# Patient Record
Sex: Female | Born: 1995 | Race: Black or African American | Hispanic: No | Marital: Single | Smoking: Never smoker
Health system: Southern US, Community
[De-identification: ages and names within clinical notes are randomized; demographics above are authoritative.]

---

## 2014-05-10 ENCOUNTER — Emergency Department (HOSPITAL_COMMUNITY): Payer: Medicaid Other

## 2014-05-10 ENCOUNTER — Encounter (HOSPITAL_COMMUNITY): Payer: Self-pay | Admitting: Emergency Medicine

## 2014-05-10 ENCOUNTER — Emergency Department (HOSPITAL_COMMUNITY)
Admission: EM | Admit: 2014-05-10 | Discharge: 2014-05-10 | Disposition: A | Discharge status: Home / Self Care | Payer: Medicaid Other | Attending: Emergency Medicine | Admitting: Emergency Medicine

## 2014-05-10 DIAGNOSIS — Y9363 Activity, rugby: Secondary | ICD-10-CM | POA: Diagnosis not present

## 2014-05-10 DIAGNOSIS — S99919A Unspecified injury of unspecified ankle, initial encounter: Secondary | ICD-10-CM

## 2014-05-10 DIAGNOSIS — Z3202 Encounter for pregnancy test, result negative: Secondary | ICD-10-CM | POA: Insufficient documentation

## 2014-05-10 DIAGNOSIS — S99929A Unspecified injury of unspecified foot, initial encounter: Secondary | ICD-10-CM

## 2014-05-10 DIAGNOSIS — Y9229 Other specified public building as the place of occurrence of the external cause: Secondary | ICD-10-CM | POA: Insufficient documentation

## 2014-05-10 DIAGNOSIS — W219XXA Striking against or struck by unspecified sports equipment, initial encounter: Secondary | ICD-10-CM | POA: Diagnosis not present

## 2014-05-10 DIAGNOSIS — S8990XA Unspecified injury of unspecified lower leg, initial encounter: Secondary | ICD-10-CM | POA: Diagnosis present

## 2014-05-10 DIAGNOSIS — Z79899 Other long term (current) drug therapy: Secondary | ICD-10-CM | POA: Diagnosis not present

## 2014-05-10 DIAGNOSIS — S93409A Sprain of unspecified ligament of unspecified ankle, initial encounter: Secondary | ICD-10-CM | POA: Diagnosis not present

## 2014-05-10 DIAGNOSIS — S93402A Sprain of unspecified ligament of left ankle, initial encounter: Secondary | ICD-10-CM

## 2014-05-10 LAB — POC URINE PREG, ED: Preg Test, Ur: NEGATIVE

## 2014-05-10 MED ORDER — TRAMADOL HCL 50 MG PO TABS: 50.0000 mg | ORAL_TABLET | Freq: Four times a day (QID) | ORAL | Status: DC | PRN

## 2014-05-10 MED ORDER — METHOCARBAMOL 500 MG PO TABS: 500.0000 mg | ORAL_TABLET | Freq: Two times a day (BID) | ORAL | Status: DC

## 2014-05-10 MED ORDER — ONDANSETRON HCL 4 MG/2ML IJ SOLN
4.0000 mg | Freq: Once | INTRAMUSCULAR | Status: AC
  Administered 2014-05-10: 4 mg via INTRAVENOUS
  Filled 2014-05-10: qty 2

## 2014-05-10 MED ORDER — MORPHINE SULFATE 4 MG/ML IJ SOLN
4.0000 mg | Freq: Once | INTRAMUSCULAR | Status: AC
  Administered 2014-05-10: 4 mg via INTRAVENOUS
  Filled 2014-05-10: qty 1

## 2014-05-10 NOTE — ED Notes (Signed)
Bed: VW09 Expected date: 05/10/14 Expected time: 1:56 PM Means of arrival: Ambulance Comments: Ankle injury

## 2014-05-10 NOTE — Discharge Instructions (Signed)

## 2014-05-10 NOTE — ED Provider Notes (Signed)
CSN: [id number]     Arrival date & time 05/10/14  1407 History   First MD Initiated Contact with Patient 05/10/14 1458     Chief Complaint  Patient presents with  . Ankle Injury     (Consider location/radiation/quality/duration/timing/severity/associated sxs/prior Treatment) HPI  18 year old female presents via EMS for evaluation of L ankle injury.  Patient plays rugby for school. Approximately 2 hours ago while playing patient was knocked down to the ground with her left foot planted on the ground. She report acute onset of sharp intense pain to her left lower leg above her ankle. She was unable to ambulate afterward.  She described the injury as "a pencil snapped in halves".  Pain worsening with ankle or leg movement.  No associated numbness or weakness.  No knee or hip pain.  Pt received fentanyl PTA that brought her pain down to a 6/10.  No prior injury to same area.  No other complaints.    History reviewed. No pertinent past medical history. History reviewed. No pertinent past surgical history. History reviewed. No pertinent family history. History  Substance Use Topics  . Smoking status: Never Smoker   . Smokeless tobacco: Not on file  . Alcohol Use: No   OB History   Grav Para Term Preterm Abortions TAB SAB Ect Mult Living                 Review of Systems  Constitutional: Negative for fever.  Musculoskeletal: Positive for arthralgias.  Neurological: Negative for numbness.      Allergies  Aspirin and Pork-derived products  Home Medications   Prior to Admission medications   Medication Sig Start Date End Date Taking? Authorizing Provider  albuterol (PROVENTIL HFA;VENTOLIN HFA) 108 (90 BASE) MCG/ACT inhaler Inhale 1 puff into the lungs every 6 (six) hours as needed for wheezing or shortness of breath.   Yes Historical Provider, MD   BP 120/76  Pulse 75  Temp(Src) 98.6 F (37 C) (Oral)  Resp 16  SpO2 99%  LMP 04/29/2014 Physical Exam  Nursing note and  vitals reviewed. Constitutional: She is oriented to person, place, and time. She appears well-developed and well-nourished. No distress.  HENT:  Head: Atraumatic.  Eyes: Conjunctivae are normal.  Neck: Neck supple.  Musculoskeletal: She exhibits tenderness (L leg: point tenderness to distal tibfib without crepitus or obvious deformity.  edema and mild bruising noted.  L ankle with decreased ROM 2/2 to pain, no deformity.  pedal pulse palpable, brisk cap refills, sensation intact distally.).  L knee and L hip without tenderness.    Neurological: She is alert and oriented to person, place, and time.  Skin: No rash noted.  Psychiatric: She has a normal mood and affect.    ED Course  Procedures (including critical care time)  3:14 PM L tibfib/ankle injury.  Pt is NVI.  Xray ordered. Pain medication given.      4:31 PM X-ray of left tib-fib and left ankle without acute fracture or dislocation. This is likely a sprain. We'll provide ASO, crutches, rice therapy, an orthopedic referral as needed. Return precautions discussed.  Labs Review Labs Reviewed  POC URINE PREG, ED    Imaging Review Dg Tibia/fibula Left  05/10/2014   CLINICAL DATA:  Ankle injury.  III accident.  Distal leg pain.  EXAM: LEFT TIBIA AND FIBULA - 2 VIEW  COMPARISON:  Left ankle radiographs performed 05/10/2014.  FINDINGS: Lateral view of the proximal 2/3 of the tibia and fibula is  included on the left ankle radiograph study performed at this exam today. Please note that the distal tibia-fibula are seen on the concurrent ankle radiographs, dictated separately.  No fracture is identified in the tibia or fibula. No suspicious bony lesion is seen.  IMPRESSION: No acute bony abnormality. Please also see concurrent left ankle radiographs.   Electronically Signed   By: Britta Mccreedy M.D.   On: 05/10/2014 15:58   Dg Ankle Complete Left  05/10/2014   CLINICAL DATA:  Leg injury. Leg pain. Acute injury. Initial encounter.  EXAM: LEFT  ANKLE COMPLETE - 3+ VIEW  COMPARISON:  None.  FINDINGS: Anatomic alignment. The ankle mortise is congruent. The talar dome is intact. Soft tissues appear within normal limits.  IMPRESSION: Negative.   Electronically Signed   By: Andreas Newport M.D.   On: 05/10/2014 15:55     EKG Interpretation None      MDM   Final diagnoses:  Left ankle sprain, initial encounter    BP 120/76  Pulse 75  Temp(Src) 98.6 F (37 C) (Oral)  Resp 16  SpO2 99%  LMP 04/29/2014  I have reviewed nursing notes and vital signs. I personally reviewed the imaging tests through PACS system  I reviewed available ER/hospitalization records thought the EMR     Fayrene Helper, [place] 05/10/14 1631

## 2014-05-10 NOTE — ED Notes (Signed)
Per EMS, pt was playing rugby.  Pt fell and twisted left ankle upon planting foot.  Pt complaining of lower leg pain.  Some swelling to ankle with discoloration.  No medical history.  Vitals:  114/72, 78, resp 22.  Fentanyl 100 mcg.  IV left hand, 22 g

## 2014-05-10 NOTE — ED Notes (Signed)
Ortho at bedside.

## 2014-05-11 ENCOUNTER — Encounter (HOSPITAL_COMMUNITY): Payer: Self-pay | Admitting: Emergency Medicine

## 2014-05-11 ENCOUNTER — Emergency Department (HOSPITAL_COMMUNITY)
Admission: EM | Admit: 2014-05-11 | Discharge: 2014-05-11 | Disposition: A | Discharge status: Home / Self Care | Payer: Medicaid Other | Attending: Emergency Medicine | Admitting: Emergency Medicine

## 2014-05-11 DIAGNOSIS — G8911 Acute pain due to trauma: Secondary | ICD-10-CM | POA: Diagnosis not present

## 2014-05-11 DIAGNOSIS — M25579 Pain in unspecified ankle and joints of unspecified foot: Secondary | ICD-10-CM | POA: Insufficient documentation

## 2014-05-11 DIAGNOSIS — Z79899 Other long term (current) drug therapy: Secondary | ICD-10-CM | POA: Insufficient documentation

## 2014-05-11 DIAGNOSIS — M25572 Pain in left ankle and joints of left foot: Secondary | ICD-10-CM

## 2014-05-11 MED ORDER — HYDROCODONE-ACETAMINOPHEN 5-325 MG PO TABS
2.0000 | ORAL_TABLET | Freq: Once | ORAL | Status: AC
  Administered 2014-05-11: 2 via ORAL
  Filled 2014-05-11: qty 2

## 2014-05-11 MED ORDER — HYDROCODONE-ACETAMINOPHEN 5-325 MG PO TABS: 1.0000 | ORAL_TABLET | ORAL | Status: DC | PRN

## 2014-05-11 NOTE — ED Provider Notes (Signed)
Medical screening examination/treatment/procedure(s) were performed by non-physician practitioner and as supervising physician I was immediately available for consultation/collaboration.   Makaylen Thieme L Bandy Honaker, MD 05/11/14 1021 

## 2014-05-11 NOTE — ED Provider Notes (Signed)
CSN: [id number]     Arrival date & time 05/11/14  1740 History   First MD Initiated Contact with Patient 05/11/14 1955     Chief Complaint  Patient presents with  . Leg Pain     (Consider location/radiation/quality/duration/timing/severity/associated sxs/prior Treatment) The history is provided by the patient and medical records.    Patient who seen in the emergency department yesterday for a left ankle injury returns today with continued pain. Patient was seen in the emergency department after her being tackled during a rugby match. States she had planted her left foot and was tackled, heard a pop when this occurred. Has pain over the anterior medial aspect of left ankle.  X-rays of the ankle and tib-fib yesterday were negative. Patient's denies any new injury or change in the pain. Denies weakness or numbness of the foot or leg. States she is taking the Robaxin and Ultram with no relief. He is wearing the ASO brace and using the crutches as directed.  History reviewed. No pertinent past medical history. History reviewed. No pertinent past surgical history. History reviewed. No pertinent family history. History  Substance Use Topics  . Smoking status: Never Smoker   . Smokeless tobacco: Not on file  . Alcohol Use: No   OB History   Grav Para Term Preterm Abortions TAB SAB Ect Mult Living                 Review of Systems  Cardiovascular: Negative for leg swelling.  Musculoskeletal: Positive for arthralgias.  Skin: Negative for color change.  Allergic/Immunologic: Negative for immunocompromised state.  Neurological: Negative for weakness and numbness.  Hematological: Does not bruise/bleed easily.      Allergies  Aspirin and Pork-derived products  Home Medications   Prior to Admission medications   Medication Sig Start Date End Date Taking? Authorizing Provider  albuterol (PROVENTIL HFA;VENTOLIN HFA) 108 (90 BASE) MCG/ACT inhaler Inhale 1 puff into the lungs every 6  (six) hours as needed for wheezing or shortness of breath.    Historical Provider, MD  methocarbamol (ROBAXIN) 500 MG tablet Take 1 tablet (500 mg total) by mouth 2 (two) times daily. 05/10/14   Fayrene Helper, PA-C  traMADol (ULTRAM) 50 MG tablet Take 1 tablet (50 mg total) by mouth every 6 (six) hours as needed. 05/10/14   Fayrene Helper, PA-C   BP 137/62  Pulse 86  Temp(Src) 98.6 F (37 C) (Oral)  Resp 18  SpO2 99%  LMP 04/29/2014 Physical Exam  Nursing note and vitals reviewed. Constitutional: She appears well-developed and well-nourished. No distress.  HENT:  Head: Normocephalic and atraumatic.  Neck: Neck supple.  Pulmonary/Chest: Effort normal.  Musculoskeletal:       Legs: Left lower extremity with tenderness over the anterior ankle and lateral ankle. Distal sensation intact distal pulses intact patient able to move all toes. Calf is soft, nontender. Ankle joint is stable, no laxity with stress in any direction.   Neurological: She is alert.  Skin: She is not diaphoretic.    ED Course  Procedures (including critical care time) Labs Review Labs Reviewed - No data to display  Imaging Review Dg Tibia/fibula Left  05/10/2014   CLINICAL DATA:  Ankle injury.  III accident.  Distal leg pain.  EXAM: LEFT TIBIA AND FIBULA - 2 VIEW  COMPARISON:  Left ankle radiographs performed 05/10/2014.  FINDINGS: Lateral view of the proximal 2/3 of the tibia and fibula is included on the left ankle radiograph study performed at this exam  today. Please note that the distal tibia-fibula are seen on the concurrent ankle radiographs, dictated separately.  No fracture is identified in the tibia or fibula. No suspicious bony lesion is seen.  IMPRESSION: No acute bony abnormality. Please also see concurrent left ankle radiographs.   Electronically Signed   By: Britta Mccreedy M.D.   On: 05/10/2014 15:58   Dg Ankle Complete Left  05/10/2014   CLINICAL DATA:  Leg injury. Leg pain. Acute injury. Initial encounter.   EXAM: LEFT ANKLE COMPLETE - 3+ VIEW  COMPARISON:  None.  FINDINGS: Anatomic alignment. The ankle mortise is congruent. The talar dome is intact. Soft tissues appear within normal limits.  IMPRESSION: Negative.   Electronically Signed   By: Andreas Newport M.D.   On: 05/10/2014 15:55     EKG Interpretation None      MDM   Final diagnoses:  Left ankle pain    Afebrile, nontoxic patient with left ankle injury while playing rugby last night.  Seen in ED yesterday with negative xrays.  Neurovascularly intact.  Joint is stable.  No new injury.  Pt returns because ultram and robaxin not helping pain.  Pt given norco and d/c home with same.  PCP follow up, ortho follow up given if persistent symptoms. D/C home.  Discussed result, findings, treatment, and follow up  with patient.  Pt given return precautions.  Pt verbalizes understanding and agrees with plan.        Trixie Dredge, PA-C 05/11/14 2102

## 2014-05-11 NOTE — Discharge Instructions (Signed)
Read the information below.  Use the prescribed medication as directed.  Please discuss all new medications with your pharmacist.  Do not take additional tylenol while taking the prescribed pain medication to avoid overdose.  You may return to the Emergency Department at any time for worsening condition or any new symptoms that concern you.  If there is any possibility that you might be pregnant, please let your health care provider know and discuss this with the pharmacist to ensure medication safety.  If you develop uncontrolled pain, weakness or numbness of the extremity, severe discoloration of the skin, or you are unable to move your toes, return to the ER for a recheck.      Ankle Pain Ankle pain is a common symptom. The bones, cartilage, tendons, and muscles of the ankle joint perform a lot of work each day. The ankle joint holds your body weight and allows you to move around. Ankle pain can occur on either side or back of 1 or both ankles. Ankle pain may be sharp and burning or dull and aching. There may be tenderness, stiffness, redness, or warmth around the ankle. The pain occurs more often when a person walks or puts pressure on the ankle. CAUSES  There are many reasons ankle pain can develop. It is important to work with your caregiver to identify the cause since many conditions can impact the bones, cartilage, muscles, and tendons. Causes for ankle pain include:  Injury, including a break (fracture), sprain, or strain often due to a fall, sports, or a high-impact activity.  Swelling (inflammation) of a tendon (tendonitis).  Achilles tendon rupture.  Ankle instability after repeated sprains and strains.  Poor foot alignment.  Pressure on a nerve (tarsal tunnel syndrome).  Arthritis in the ankle or the lining of the ankle.  Crystal formation in the ankle (gout or pseudogout). DIAGNOSIS  A diagnosis is based on your medical history, your symptoms, results of your physical exam,  and results of diagnostic tests. Diagnostic tests may include X-ray exams or a computerized magnetic scan (magnetic resonance imaging, MRI). TREATMENT  Treatment will depend on the cause of your ankle pain and may include:  Keeping pressure off the ankle and limiting activities.  Using crutches or other walking support (a cane or brace).  Using rest, ice, compression, and elevation.  Participating in physical therapy or home exercises.  Wearing shoe inserts or special shoes.  Losing weight.  Taking medications to reduce pain or swelling or receiving an injection.  Undergoing surgery. HOME CARE INSTRUCTIONS   Only take over-the-counter or prescription medicines for pain, discomfort, or fever as directed by your caregiver.  Put ice on the injured area.  Put ice in a plastic bag.  Place a towel between your skin and the bag.  Leave the ice on for 15-20 minutes at a time, 03-04 times a day.  Keep your leg raised (elevated) when possible to lessen swelling.  Avoid activities that cause ankle pain.  Follow specific exercises as directed by your caregiver.  Record how often you have ankle pain, the location of the pain, and what it feels like. This information may be helpful to you and your caregiver.  Ask your caregiver about returning to work or sports and whether you should drive.  Follow up with your caregiver for further examination, therapy, or testing as directed. SEEK MEDICAL CARE IF:   Pain or swelling continues or worsens beyond 1 week.  You have an oral temperature above 102 F (38.9  C).  You are feeling unwell or have chills.  You are having an increasingly difficult time with walking.  You have loss of sensation or other new symptoms.  You have questions or concerns. MAKE SURE YOU:   Understand these instructions.  Will watch your condition.  Will get help right away if you are not doing well or get worse. Document Released: 02/02/2010 Document  Revised: 11/07/2011 Document Reviewed: 02/02/2010 [facility] Patient Information 2015 [place], [place]. This information is not intended to replace advice given to you by your health care provider. Make sure you discuss any questions you have with your health care provider.

## 2014-05-11 NOTE — ED Provider Notes (Signed)
Medical screening examination/treatment/procedure(s) were performed by non-physician practitioner and as supervising physician I was immediately available for consultation/collaboration.   EKG Interpretation None        Elwin Mocha, MD 05/11/14 [phone]

## 2014-05-11 NOTE — ED Notes (Signed)
Pt reports playing yesterday and injuring left leg, was seen at [facility] yesterday for it and was told it was a sprain. Reports no relief with pain meds. Pt has splint on left ankle and using crutches. Reports pain is left lower leg and ankle.

## 2015-01-16 ENCOUNTER — Encounter (HOSPITAL_COMMUNITY): Payer: Self-pay

## 2015-01-16 ENCOUNTER — Emergency Department (HOSPITAL_COMMUNITY)
Admission: EM | Admit: 2015-01-16 | Discharge: 2015-01-16 | Disposition: A | Discharge status: Home / Self Care | Payer: Medicaid Other | Attending: Emergency Medicine | Admitting: Emergency Medicine

## 2015-01-16 DIAGNOSIS — L729 Follicular cyst of the skin and subcutaneous tissue, unspecified: Secondary | ICD-10-CM | POA: Diagnosis present

## 2015-01-16 DIAGNOSIS — N611 Abscess of the breast and nipple: Secondary | ICD-10-CM

## 2015-01-16 DIAGNOSIS — Z79899 Other long term (current) drug therapy: Secondary | ICD-10-CM | POA: Insufficient documentation

## 2015-01-16 DIAGNOSIS — Z792 Long term (current) use of antibiotics: Secondary | ICD-10-CM | POA: Diagnosis not present

## 2015-01-16 DIAGNOSIS — N61 Inflammatory disorders of breast: Secondary | ICD-10-CM | POA: Insufficient documentation

## 2015-01-16 MED ORDER — CEPHALEXIN 500 MG PO CAPS: 500.0000 mg | ORAL_CAPSULE | Freq: Four times a day (QID) | ORAL | Status: DC

## 2015-01-16 NOTE — ED Notes (Signed)
Pt presents with 1 week h/o cyst to R breast, pt denies any redness or drainage from area, reports area is tender to palpation.

## 2015-01-16 NOTE — Discharge Instructions (Signed)

## 2015-01-16 NOTE — ED Provider Notes (Signed)
CSN: [id number]     Arrival date & time 01/16/15  1302 History  This chart was scribed for non-physician practitioner Marlon Peliffany Aleeza [place], PA, working with Elwin MochaBlair Walden, MD, by Tanda RockersMargaux Venter, ED Scribe. This patient was seen in room TR08C/TR08C and the patient's care was started at 1:38 PM.     Chief Complaint  Patient presents with  . Cyst   The history is provided by the patient. No language interpreter was used.     HPI Comments: Betty Odonnell is a 19 y.o. female who presents to the Emergency Department complaining of lump to right breast x 1.5 weeks. Pt notes increased pain to the area. No redness or drainage to the area. Denies FHx cysts on breasts or endometriosis. Pt does have routine visits to a gynecologist. Denies fever, chills, or any other associated symptoms.    History reviewed. No pertinent past medical history. History reviewed. No pertinent past surgical history. History reviewed. No pertinent family history. History  Substance Use Topics  . Smoking status: Never Smoker   . Smokeless tobacco: Not on file  . Alcohol Use: No   OB History    No data available     Review of Systems  Constitutional: Negative for fever and chills.  Skin:       Lump to right breast.   All other systems reviewed and are negative.   Allergies  Aspirin and Pork-derived products  Home Medications   Prior to Admission medications   Medication Sig Start Date End Date Taking? Authorizing Provider  albuterol (PROVENTIL HFA;VENTOLIN HFA) 108 (90 BASE) MCG/ACT inhaler Inhale 1 puff into the lungs every 6 (six) hours as needed for wheezing or shortness of breath.    Historical Provider, MD  cephALEXin (KEFLEX) 500 MG capsule Take 1 capsule (500 mg total) by mouth 4 (four) times daily. 01/16/15   Marlon Peliffany Kataya [place], PA-C  HYDROcodone-acetaminophen (NORCO/VICODIN) 5-325 MG per tablet Take 1-2 tablets by mouth every 4 (four) hours as needed. 05/11/14   Trixie DredgeEmily West, PA-C  methocarbamol (ROBAXIN)  500 MG tablet Take 1 tablet (500 mg total) by mouth 2 (two) times daily. 05/10/14   Fayrene HelperBowie Tran, PA-C  traMADol (ULTRAM) 50 MG tablet Take 1 tablet (50 mg total) by mouth every 6 (six) hours as needed. 05/10/14   Fayrene HelperBowie Tran, PA-C   Triage Vitals: BP 121/67 mmHg  Pulse 60  Temp(Src) 98.4 F (36.9 C) (Oral)  Resp 16  Ht 5\' 3"  (1.6 m)  SpO2 99%  LMP 01/05/2015 (Exact Date)   Physical Exam  Constitutional: She is oriented to person, place, and time. She appears well-developed and well-nourished. No distress.  HENT:  Head: Normocephalic and atraumatic.  Eyes: Conjunctivae and EOM are normal.  Neck: Neck supple. No tracheal deviation present.  Cardiovascular: Normal rate.   Pulmonary/Chest: Effort normal. No respiratory distress.    Musculoskeletal: Normal range of motion.  Neurological: She is alert and oriented to person, place, and time.  Skin: Skin is warm and dry.  Psychiatric: She has a normal mood and affect. Her behavior is normal.  Nursing note and vitals reviewed.   ED Course  Procedures (including critical care time)  DIAGNOSTIC STUDIES: Oxygen Saturation is 99% on RA, normal by my interpretation.    COORDINATION OF CARE: 1:41 PM-Discussed treatment plan which includes rx antibiotic with pt at bedside and pt agreed to plan. [facility] if abscess increases in size and the abx are not affective. She should see  improvement in pain and size within 24-48 hours. Strict return to ED precautions. Also advised [facility] is [facility] as well.   The patient has firmness that is small approx 1 cm and tender. Without obvious fluid collection and it being on the breast we will try a trial of   Labs Review Labs Reviewed - No data to display  Imaging Review No results found.   EKG Interpretation None      MDM   Final diagnoses:  Breast abscess of female   19 y.o.Betty Odonnell's evaluation in the Emergency Department is  complete. It has been determined that no acute conditions requiring further emergency intervention are present at this time. The patient/guardian have been advised of the diagnosis and plan. We have discussed signs and symptoms that warrant return to the ED, such as changes or worsening in symptoms.  Vital signs are stable at discharge. Filed Vitals:   01/16/15 1313  BP: 121/67  Pulse: 60  Temp: 98.4 F (36.9 C)  Resp: 16    Patient/guardian has voiced understanding and agreed to follow-up with the PCP or specialist.  I personally performed the services described in this documentation, which was scribed in my presence. The recorded information has been reviewed and is accurate.     Marlon Peliffany Breken Nazari, PA-C 01/16/15 1547  Elwin MochaBlair Walden, MD 01/16/15 [phone]

## 2015-06-18 IMAGING — CR DG ANKLE COMPLETE 3+V*L*
4 series · 4 of 4 positions shown · non-contrast
Comparison: None.

CLINICAL DATA: Leg injury. Leg pain. Acute injury. Initial
encounter.

EXAM:
LEFT ANKLE COMPLETE - 3+ VIEW

[x ankle ap left]
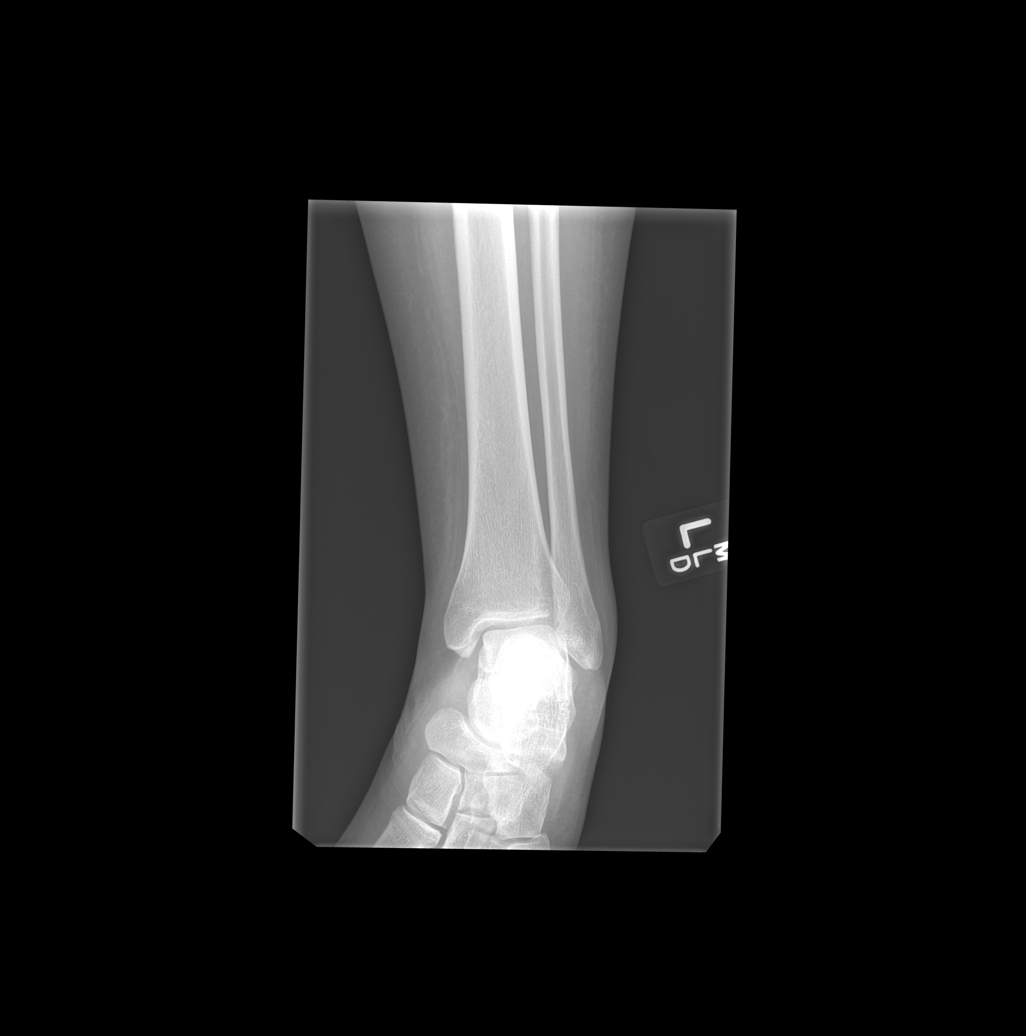

[x ankle obl left]
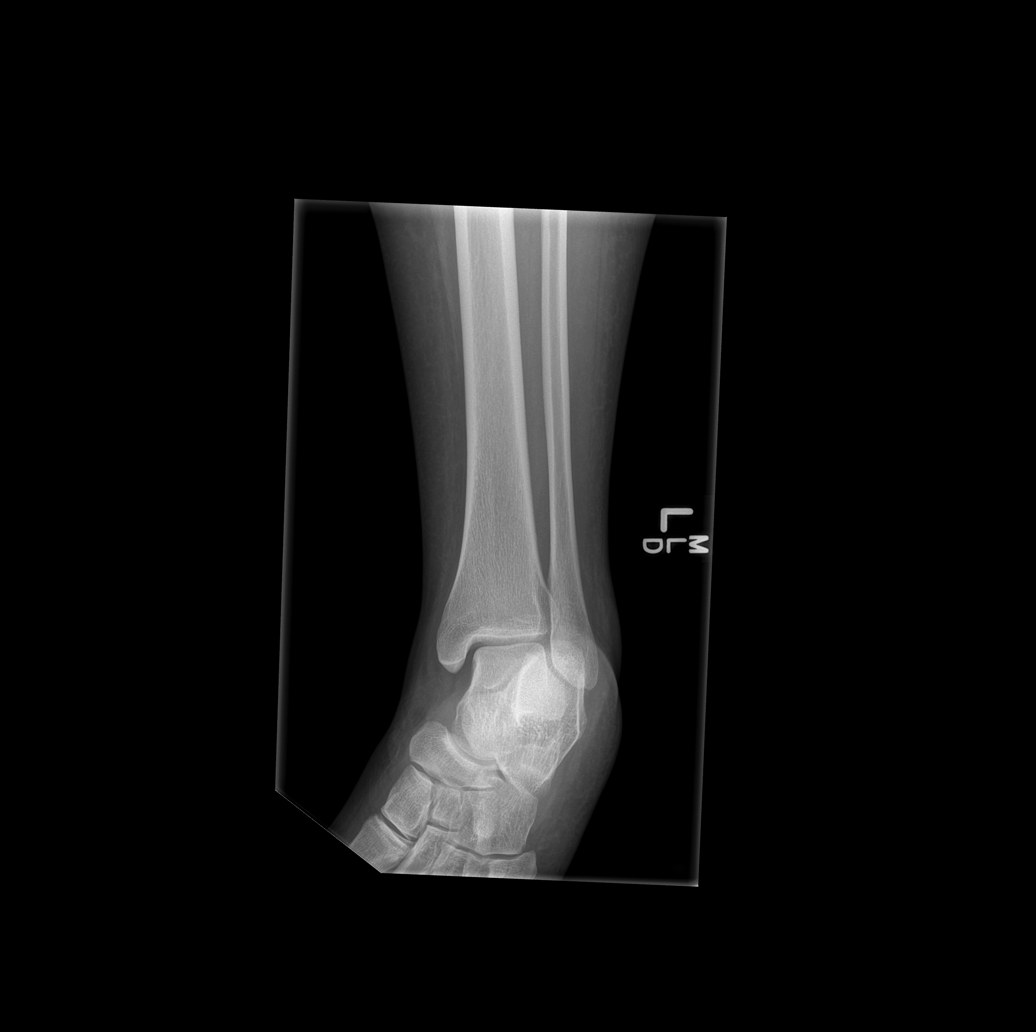

[x ankle lat left (1 of 2)]
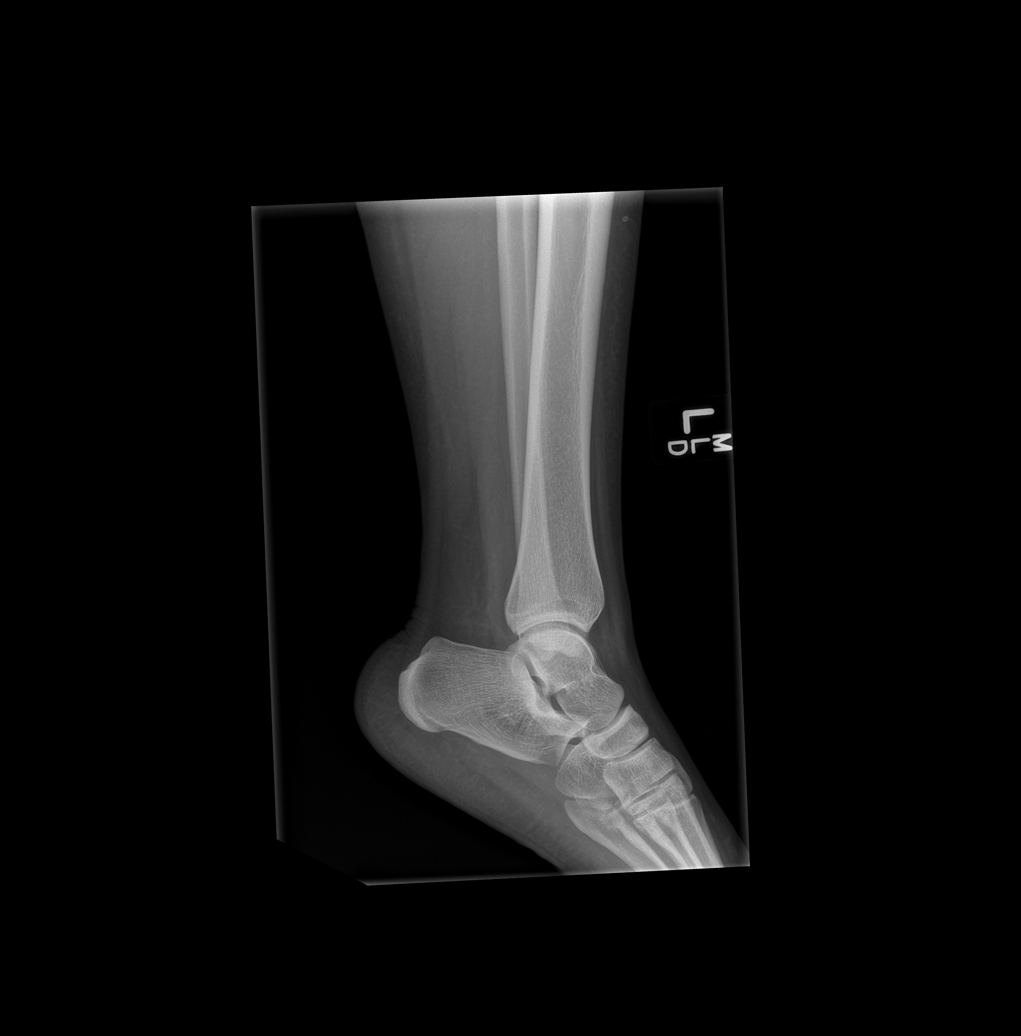

[x ankle lat left (2 of 2)]
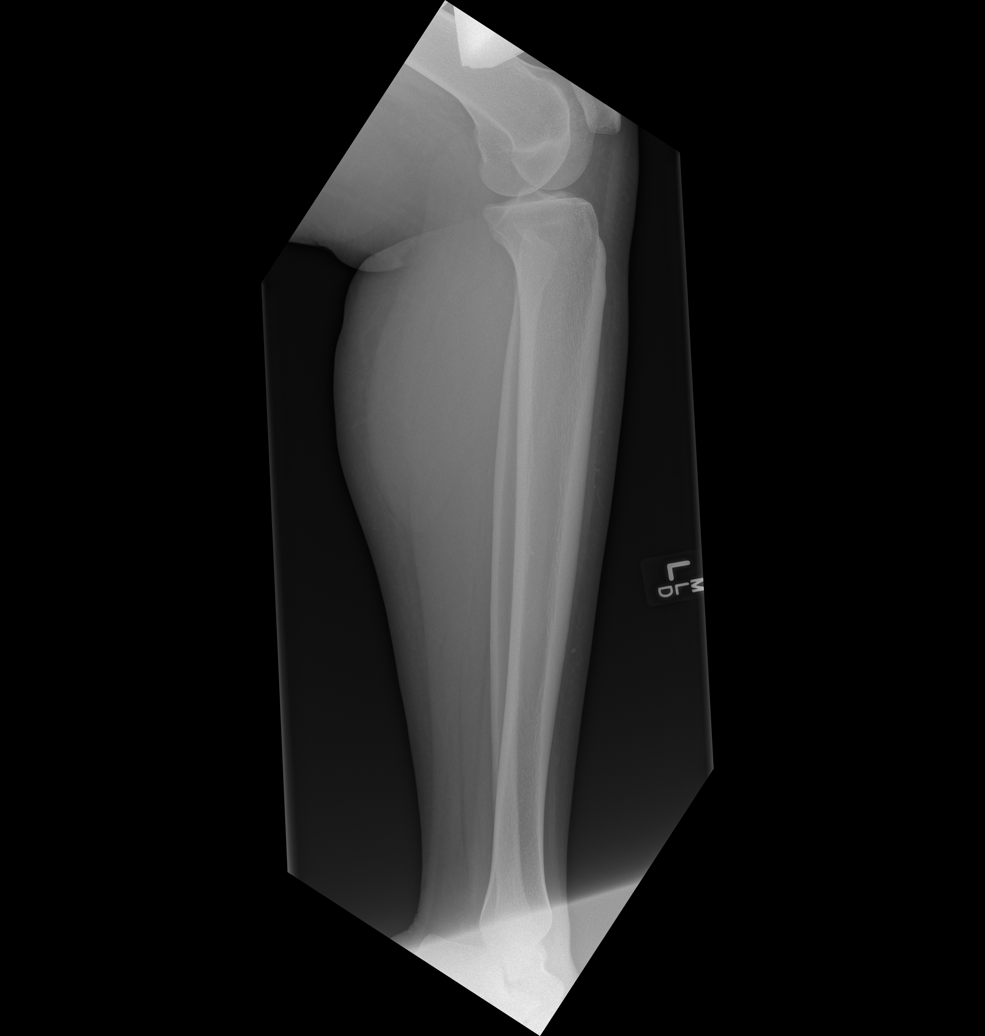

[4 of 4 positions shown; findings below may reference images not displayed]

FINDINGS: Anatomic alignment. The ankle mortise is congruent. The talar dome
is intact. Soft tissues appear within normal limits.
IMPRESSION: Negative.

## 2015-11-24 ENCOUNTER — Emergency Department (HOSPITAL_COMMUNITY)
Admission: EM | Admit: 2015-11-24 | Discharge: 2015-11-24 | Disposition: A | Discharge status: Home / Self Care | Payer: Medicaid Other | Attending: Emergency Medicine | Admitting: Emergency Medicine

## 2015-11-24 ENCOUNTER — Encounter (HOSPITAL_COMMUNITY): Payer: Self-pay

## 2015-11-24 ENCOUNTER — Emergency Department (HOSPITAL_COMMUNITY): Payer: Medicaid Other

## 2015-11-24 DIAGNOSIS — R6883 Chills (without fever): Secondary | ICD-10-CM | POA: Insufficient documentation

## 2015-11-24 DIAGNOSIS — R110 Nausea: Secondary | ICD-10-CM | POA: Insufficient documentation

## 2015-11-24 DIAGNOSIS — Z792 Long term (current) use of antibiotics: Secondary | ICD-10-CM | POA: Insufficient documentation

## 2015-11-24 DIAGNOSIS — Z79899 Other long term (current) drug therapy: Secondary | ICD-10-CM | POA: Insufficient documentation

## 2015-11-24 DIAGNOSIS — J069 Acute upper respiratory infection, unspecified: Secondary | ICD-10-CM

## 2015-11-24 MED ORDER — ACETAMINOPHEN 325 MG PO TABS
325.0000 mg | ORAL_TABLET | Freq: Once | ORAL | Status: AC
  Administered 2015-11-24: 325 mg via ORAL
  Filled 2015-11-24: qty 1

## 2015-11-24 MED ORDER — IBUPROFEN 800 MG PO TABS
800.0000 mg | ORAL_TABLET | Freq: Once | ORAL | Status: AC
  Administered 2015-11-24: 800 mg via ORAL
  Filled 2015-11-24 (×2): qty 1

## 2015-11-24 MED ORDER — IBUPROFEN 800 MG PO TABS: 800.0000 mg | ORAL_TABLET | Freq: Three times a day (TID) | ORAL | Status: AC

## 2015-11-24 NOTE — Discharge Instructions (Signed)
1. Continue home medications. 2. Start taking ibuprofen as needed.  Continue taking Cold and Flu for your symptoms every 4 hours.  DO NOT take more than 4000 mg of Tylenol in 24 hours.  Drink PLENTY of fluids. 3.  Follow up with your PCP in 2-3 days for re-evaluation.     Upper Respiratory Infection, Adult Most upper respiratory infections (URIs) are a viral infection of the air passages leading to the lungs. A URI affects the nose, throat, and upper air passages. The most common type of URI is nasopharyngitis and is typically referred to as "the common cold." URIs run their course and usually go away on their own. Most of the time, a URI does not require medical attention, but sometimes a bacterial infection in the upper airways can follow a viral infection. This is called a secondary infection. Sinus and middle ear infections are common types of secondary upper respiratory infections. Bacterial pneumonia can also complicate a URI. A URI can worsen asthma and chronic obstructive pulmonary disease (COPD). Sometimes, these complications can require emergency medical care and may be life threatening.  CAUSES Almost all URIs are caused by viruses. A virus is a type of germ and can spread from one person to another.  RISKS FACTORS You may be at risk for a URI if:   You smoke.   You have chronic heart or lung disease.  You have a weakened defense (immune) system.   You are very young or very old.   You have nasal allergies or asthma.  You work in crowded or poorly ventilated areas.  You work in health care facilities or schools. SIGNS AND SYMPTOMS  Symptoms typically develop 2-3 days after you come in contact with a cold virus. Most viral URIs last 7-10 days. However, viral URIs from the influenza virus (flu virus) can last 14-18 days and are typically more severe. Symptoms may include:   Runny or stuffy (congested) nose.   Sneezing.   Cough.   Sore throat.   Headache.    Fatigue.   Fever.   Loss of appetite.   Pain in your forehead, behind your eyes, and over your cheekbones (sinus pain).  Muscle aches.  DIAGNOSIS  Your health care provider may diagnose a URI by:  Physical exam.  Tests to check that your symptoms are not due to another condition such as:  Strep throat.  Sinusitis.  Pneumonia.  Asthma. TREATMENT  A URI goes away on its own with time. It cannot be cured with medicines, but medicines may be prescribed or recommended to relieve symptoms. Medicines may help:  Reduce your fever.  Reduce your cough.  Relieve nasal congestion. HOME CARE INSTRUCTIONS   Take medicines only as directed by your health care provider.   Gargle warm saltwater or take cough drops to comfort your throat as directed by your health care provider.  Use a warm mist humidifier or inhale steam from a shower to increase air moisture. This may make it easier to breathe.  Drink enough fluid to keep your urine clear or pale yellow.   Eat soups and other clear broths and maintain good nutrition.   Rest as needed.   Return to work when your temperature has returned to normal or as your health care provider advises. You may need to stay home longer to avoid infecting others. You can also use a face mask and careful hand washing to prevent spread of the virus.  Increase the usage of your inhaler if  you have asthma.   Do not use any tobacco products, including cigarettes, chewing tobacco, or electronic cigarettes. If you need help quitting, ask your health care provider. PREVENTION  The best way to protect yourself from getting a cold is to practice good hygiene.   Avoid oral or hand contact with people with cold symptoms.   Wash your hands often if contact occurs.  There is no clear evidence that vitamin C, vitamin E, echinacea, or exercise reduces the chance of developing a cold. However, it is always recommended to get plenty of rest,  exercise, and practice good nutrition.  SEEK MEDICAL CARE IF:   You are getting worse rather than better.   Your symptoms are not controlled by medicine.   You have chills.  You have worsening shortness of breath.  You have brown or red mucus.  You have yellow or brown nasal discharge.  You have pain in your face, especially when you bend forward.  You have a fever.  You have swollen neck glands.  You have pain while swallowing.  You have white areas in the back of your throat. SEEK IMMEDIATE MEDICAL CARE IF:   You have severe or persistent:  Headache.  Ear pain.  Sinus pain.  Chest pain.  You have chronic lung disease and any of the following:  Wheezing.  Prolonged cough.  Coughing up blood.  A change in your usual mucus.  You have a stiff neck.  You have changes in your:  Vision.  Hearing.  Thinking.  Mood. MAKE SURE YOU:   Understand these instructions.  Will watch your condition.  Will get help right away if you are not doing well or get worse.   This information is not intended to replace advice given to you by your health care provider. Make sure you discuss any questions you have with your health care provider.   Document Released: 02/08/2001 Document Revised: 12/30/2014 Document Reviewed: 11/20/2013 Elsevier Interactive Patient Education [employer]

## 2015-11-24 NOTE — ED Notes (Signed)
Pt here with cough/headache for 3 days.  Cough makes head hurt.  Pt states unknown for fever but has chills.  Nausea with no vomiting.  Decreased appetite but eating.

## 2015-11-24 NOTE — ED Provider Notes (Signed)
CSN: [id number]     Arrival date & time 11/24/15  1039 History  By signing my name below, I, Tanda Rockers, attest that this documentation has been prepared under the direction and in the presence of Cheri Fowler, PA-C. Electronically Signed: Tanda Rockers, ED Scribe. 11/24/2015. 12:26 PM.   Chief Complaint  Patient presents with  . Cough  . Headache   The history is provided by the patient. No language interpreter was used.     HPI Comments: Betty Odonnell is a 20 y.o. female who presents to the Emergency Department complaining of gradual onset, constant, dry cough x 3 days. Pt also complains of a diffuse headache, chills, nausea, and diffuse generalized chest pain with coughing. She does not have any chest pain at rest or with deep inspiration. Pt mentions having abdominal pain last night that has since resolved as well as a sore throat this morning that has since resolved. She has been taking OTC Cold & Flu medication with some relief. Denies rhinorrhea, congestion, ear pain, fever, vomiting, shortness of breath, hemoptysis, leg swelling, or any other associated symptoms. No hx DVT/PE. She is not on estrogen. No recent surgery.   History reviewed. No pertinent past medical history. History reviewed. No pertinent past surgical history. History reviewed. No pertinent family history. Social History  Substance Use Topics  . Smoking status: Never Smoker   . Smokeless tobacco: None  . Alcohol Use: No   OB History    No data available     Review of Systems  Constitutional: Positive for chills. Negative for fever.  HENT: Positive for sore throat (resolved. ). Negative for congestion, ear pain and rhinorrhea.   Respiratory: Positive for cough. Negative for shortness of breath.   Cardiovascular: Positive for chest pain (with coughing). Negative for leg swelling.  Gastrointestinal: Positive for nausea and abdominal pain (resolved. ). Negative for vomiting.  Neurological: Positive for  headaches.  All other systems reviewed and are negative.  Allergies  Aspirin and Pork-derived products  Home Medications   Prior to Admission medications   Medication Sig Start Date End Date Taking? Authorizing Provider  albuterol (PROVENTIL HFA;VENTOLIN HFA) 108 (90 BASE) MCG/ACT inhaler Inhale 1 puff into the lungs every 6 (six) hours as needed for wheezing or shortness of breath.    Historical Provider, MD  cephALEXin (KEFLEX) 500 MG capsule Take 1 capsule (500 mg total) by mouth 4 (four) times daily. 01/16/15   Marlon Pel, PA-C  HYDROcodone-acetaminophen (NORCO/VICODIN) 5-325 MG per tablet Take 1-2 tablets by mouth every 4 (four) hours as needed. 05/11/14   Trixie Dredge, PA-C  methocarbamol (ROBAXIN) 500 MG tablet Take 1 tablet (500 mg total) by mouth 2 (two) times daily. 05/10/14   Fayrene Helper, PA-C  traMADol (ULTRAM) 50 MG tablet Take 1 tablet (50 mg total) by mouth every 6 (six) hours as needed. 05/10/14   Fayrene Helper, PA-C   BP 117/69 mmHg  Pulse 97  Temp(Src) 100.1 F (37.8 C) (Oral)  Resp 18  SpO2 100%  LMP 11/19/2015   Physical Exam  Constitutional: She is oriented to person, place, and time. She appears well-developed and well-nourished.  Non-toxic appearance. She does not have a sickly appearance. She does not appear ill.  HENT:  Head: Normocephalic and atraumatic.  Right Ear: Tympanic membrane and external ear normal.  Left Ear: Tympanic membrane and external ear normal.  Nose: Nose normal.  Mouth/Throat: Uvula is midline, oropharynx is clear and moist and mucous membranes are normal. No oropharyngeal  exudate, posterior oropharyngeal edema or posterior oropharyngeal erythema.  Eyes: Conjunctivae are normal. Pupils are equal, round, and reactive to light.  Neck: Normal range of motion. Neck supple.  No nuchal rigidity.   Cardiovascular: Normal rate, regular rhythm and normal heart sounds.   No murmur heard. Pulmonary/Chest: Effort normal and breath sounds normal. No  accessory muscle usage or stridor. No respiratory distress. She has no wheezes. She has no rhonchi. She has no rales.  Abdominal: Soft. Bowel sounds are normal. She exhibits no distension. There is no tenderness.  Musculoskeletal: Normal range of motion.  Lymphadenopathy:    She has no cervical adenopathy.  Neurological: She is alert and oriented to person, place, and time.  Speech clear without dysarthria.  Skin: Skin is warm and dry.  Psychiatric: She has a normal mood and affect. Her behavior is normal.    ED Course  Procedures (including critical care time)  DIAGNOSTIC STUDIES: Oxygen Saturation is 100% on RA, normal by my interpretation.    COORDINATION OF CARE: 12:26 PM-Discussed treatment plan with pt at bedside and pt agreed to plan.   Labs Review Labs Reviewed - No data to display  Imaging Review Dg Chest 2 View  11/24/2015  CLINICAL DATA:  Cough and congestion for 2-3 days EXAM: CHEST  2 VIEW COMPARISON:  None FINDINGS: The heart size and mediastinal contours are within normal limits. Both lungs are clear. Scoliosis deformity is identified. IMPRESSION: 1. No acute cardiopulmonary abnormalities. Electronically Signed   By: Signa Kellaylor  Stroud M.D.   On: 11/24/2015 12:19   I have personally reviewed and evaluated these images as part of my medical decision-making.   EKG Interpretation None      MDM   Final diagnoses:  URI (upper respiratory infection)   Patient presents with likely viral URI. On arrival, temperature 100.1, VSS. No fever, neck stiffness, hemoptysis. Patient appears non-toxic or septic.  HENT exam unremarkable.  Heart RRR, lungs CTAB, abdomen soft and benign.  Doubt meningitis.  PERC negative, doubt PE.  CXR negative. Patient given  PO fluids in ED.  Temp 101.9.  Patient given ibuprofen and tylenol.  Upon recheck, T 101 with HR 105.  Recommend tylenol and ibuprofen for symptom control.  Plenty of fluids.  Discussed return precautions.  Patient agrees and  acknowledges the above plan for discharge.   I personally performed the services described in this documentation, which was scribed in my presence. The recorded information has been reviewed and is accurate.      Cheri FowlerKayla Quin Mcpherson, PA-C 11/24/15 1348  Rolland PorterMark James, MD 11/25/15 [phone]

## 2015-11-27 ENCOUNTER — Encounter (HOSPITAL_COMMUNITY): Payer: Self-pay | Admitting: Emergency Medicine

## 2015-11-27 ENCOUNTER — Emergency Department (HOSPITAL_COMMUNITY)
Admission: EM | Admit: 2015-11-27 | Discharge: 2015-11-27 | Disposition: A | Discharge status: Home / Self Care | Payer: Medicaid Other | Attending: Emergency Medicine | Admitting: Emergency Medicine

## 2015-11-27 DIAGNOSIS — Z791 Long term (current) use of non-steroidal anti-inflammatories (NSAID): Secondary | ICD-10-CM | POA: Insufficient documentation

## 2015-11-27 DIAGNOSIS — Z79899 Other long term (current) drug therapy: Secondary | ICD-10-CM | POA: Insufficient documentation

## 2015-11-27 DIAGNOSIS — Q846 Other congenital malformations of nails: Secondary | ICD-10-CM

## 2015-11-27 MED ORDER — CEPHALEXIN 500 MG PO CAPS: 500.0000 mg | ORAL_CAPSULE | Freq: Four times a day (QID) | ORAL | Status: AC

## 2015-11-27 NOTE — Discharge Instructions (Signed)

## 2015-11-27 NOTE — ED Provider Notes (Signed)
CSN: [id number]     Arrival date & time 11/27/15  1150 History   First MD Initiated Contact with Patient 11/27/15 1340     Chief Complaint  Patient presents with  . finger infection      (Consider location/radiation/quality/duration/timing/severity/associated sxs/prior Treatment) HPI Comments: Patient here complaining of pain to her right index finger after pulling some scan off the lateral aspect of it. Is now red and edematous. No drainage noted from the finger. No fever noted. No numbness to the finger at this time. Symptoms persistent and no treatment use prior to arrival.  The history is provided by the patient.    History reviewed. No pertinent past medical history. History reviewed. No pertinent past surgical history. No family history on file. Social History  Substance Use Topics  . Smoking status: Never Smoker   . Smokeless tobacco: None  . Alcohol Use: No   OB History    No data available     Review of Systems  All other systems reviewed and are negative.     Allergies  Aspirin and Pork-derived products  Home Medications   Prior to Admission medications   Medication Sig Start Date End Date Taking? Authorizing Provider  albuterol (PROVENTIL HFA;VENTOLIN HFA) 108 (90 Base) MCG/ACT inhaler Inhale 2 puffs into the lungs every 6 (six) hours as needed for wheezing or shortness of breath.   Yes Historical Provider, MD  Nutritional Supplements (COLD AND FLU PO) Take 2 tablets by mouth 2 (two) times daily as needed. EQUATE BRAND for cold symptoms   Yes Historical Provider, MD  ibuprofen (ADVIL,MOTRIN) 800 MG tablet Take 1 tablet (800 mg total) by mouth 3 (three) times daily. 11/24/15   Kayla Rose, PA-C   BP 115/66 mmHg  Pulse 66  Temp(Src) 98.3 F (36.8 C) (Oral)  Resp 16  SpO2 100%  LMP 11/19/2015 Physical Exam  Constitutional: She is oriented to person, place, and time. She appears well-developed and well-nourished.  Non-toxic appearance. No distress.  HENT:    Head: Normocephalic and atraumatic.  Eyes: Conjunctivae, EOM and lids are normal. Pupils are equal, round, and reactive to light.  Neck: Normal range of motion. Neck supple. No tracheal deviation present. No thyroid mass present.  Cardiovascular: Normal rate, regular rhythm and normal heart sounds.  Exam reveals no gallop.   No murmur heard. Pulmonary/Chest: Effort normal and breath sounds normal. No stridor. No respiratory distress. She has no decreased breath sounds. She has no wheezes. She has no rhonchi. She has no rales.  Abdominal: Soft. Normal appearance and bowel sounds are normal. She exhibits no distension. There is no tenderness. There is no rebound and no CVA tenderness.  Musculoskeletal: Normal range of motion. She exhibits no edema or tenderness.       Hands: Neurological: She is alert and oriented to person, place, and time. She has normal strength. No cranial nerve deficit or sensory deficit. GCS eye subscore is 4. GCS verbal subscore is 5. GCS motor subscore is 6.  Skin: Skin is warm and dry. No abrasion and no rash noted.  Psychiatric: She has a normal mood and affect. Her speech is normal and behavior is normal.  Nursing note and vitals reviewed.   ED Course  Procedures (including critical care time) Labs Review Labs Reviewed - No data to display  Imaging Review No results found. I have personally reviewed and evaluated these images and lab results as part of my medical decision-making.   EKG Interpretation None  MDM   Final diagnoses:  None    Placed patient on Keflex and return precautions given    Lorre Nick, MD 11/27/15 1345

## 2015-11-27 NOTE — ED Notes (Signed)
Per pt, states she pulled cuticle off right pointer finger, now swollen and red

## 2017-01-01 IMAGING — CR DG CHEST 2V
2 series · 2 of 2 positions shown · non-contrast
Comparison: None

CLINICAL DATA: Cough and congestion for 2-3 days

EXAM:
CHEST  2 VIEW

[w chest pa]
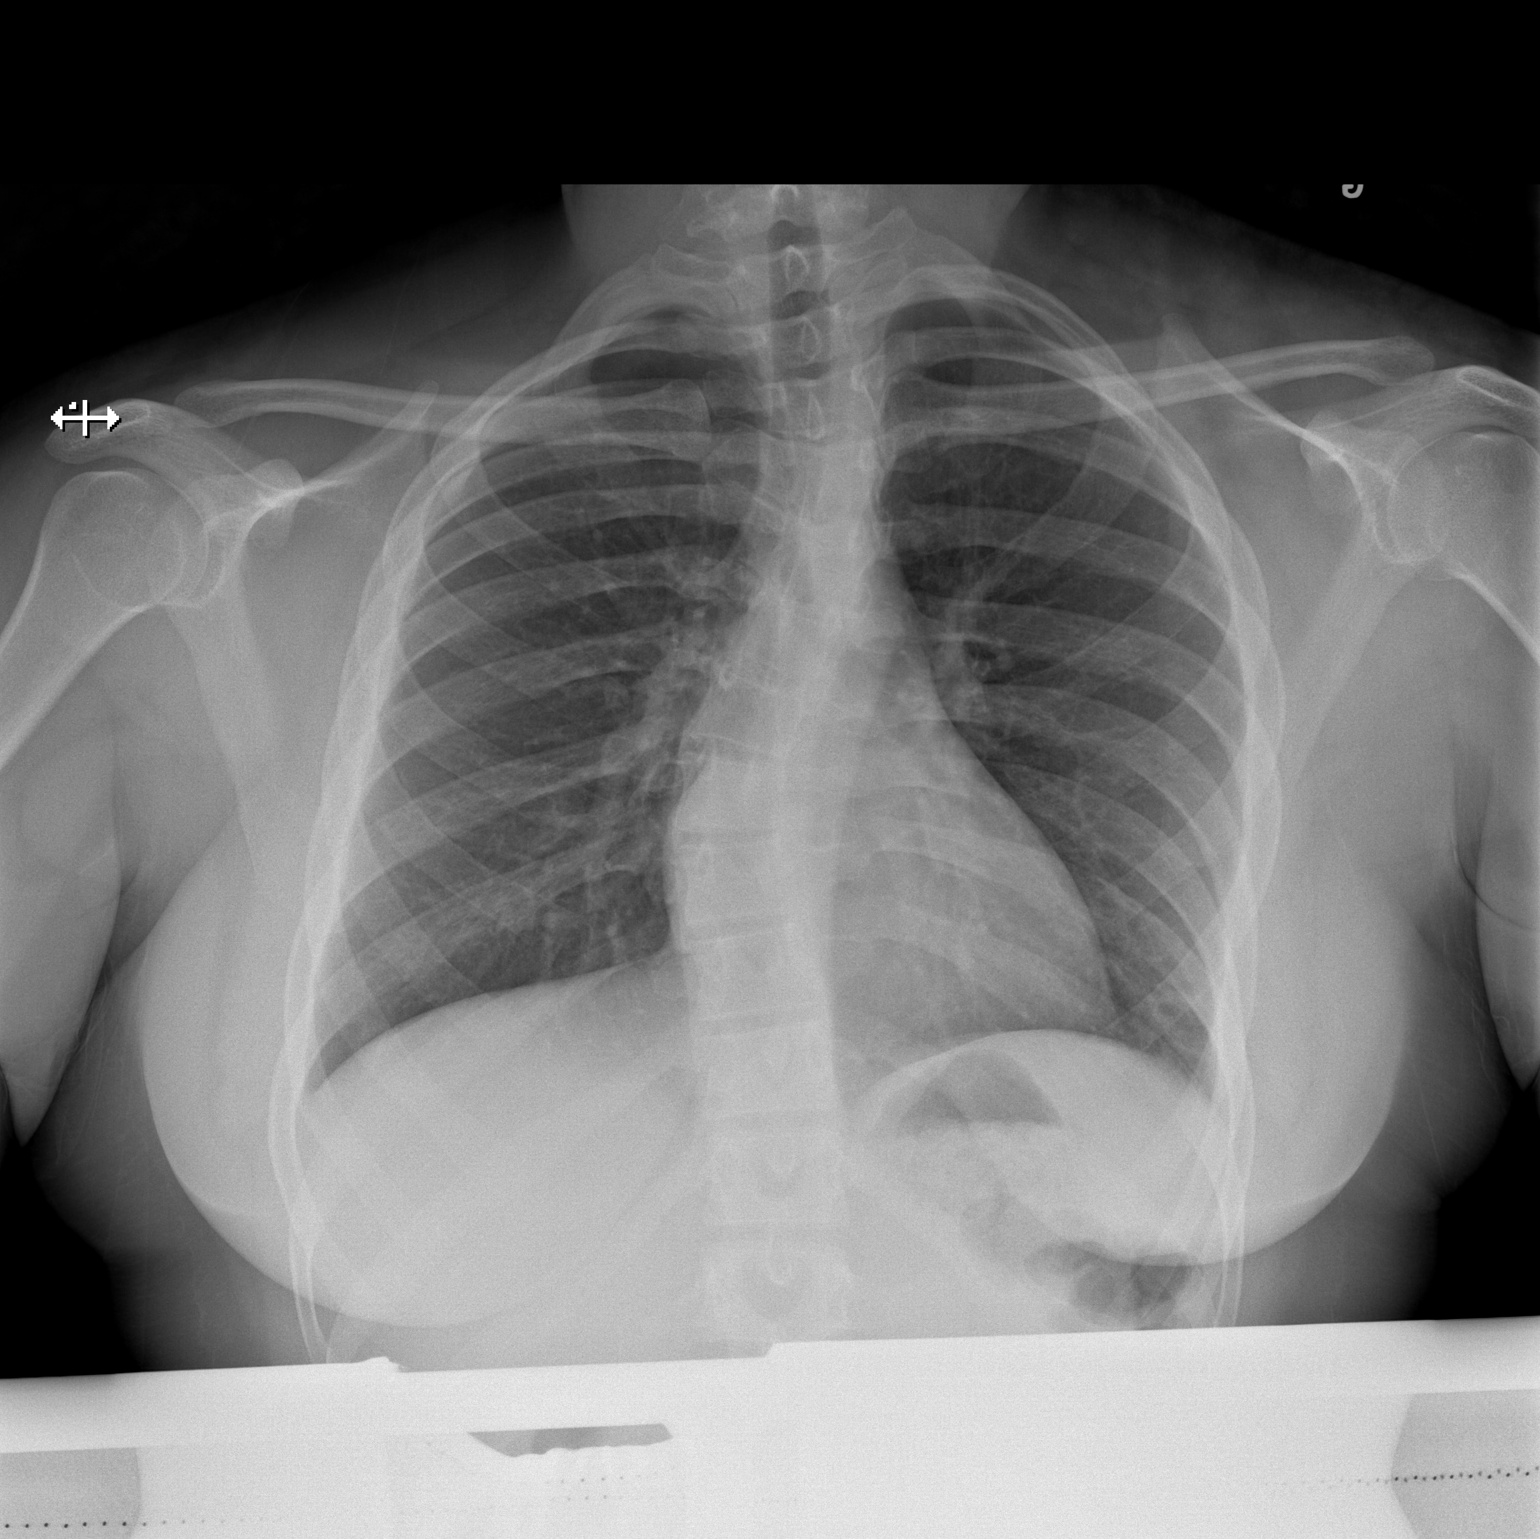

[w chest lat]
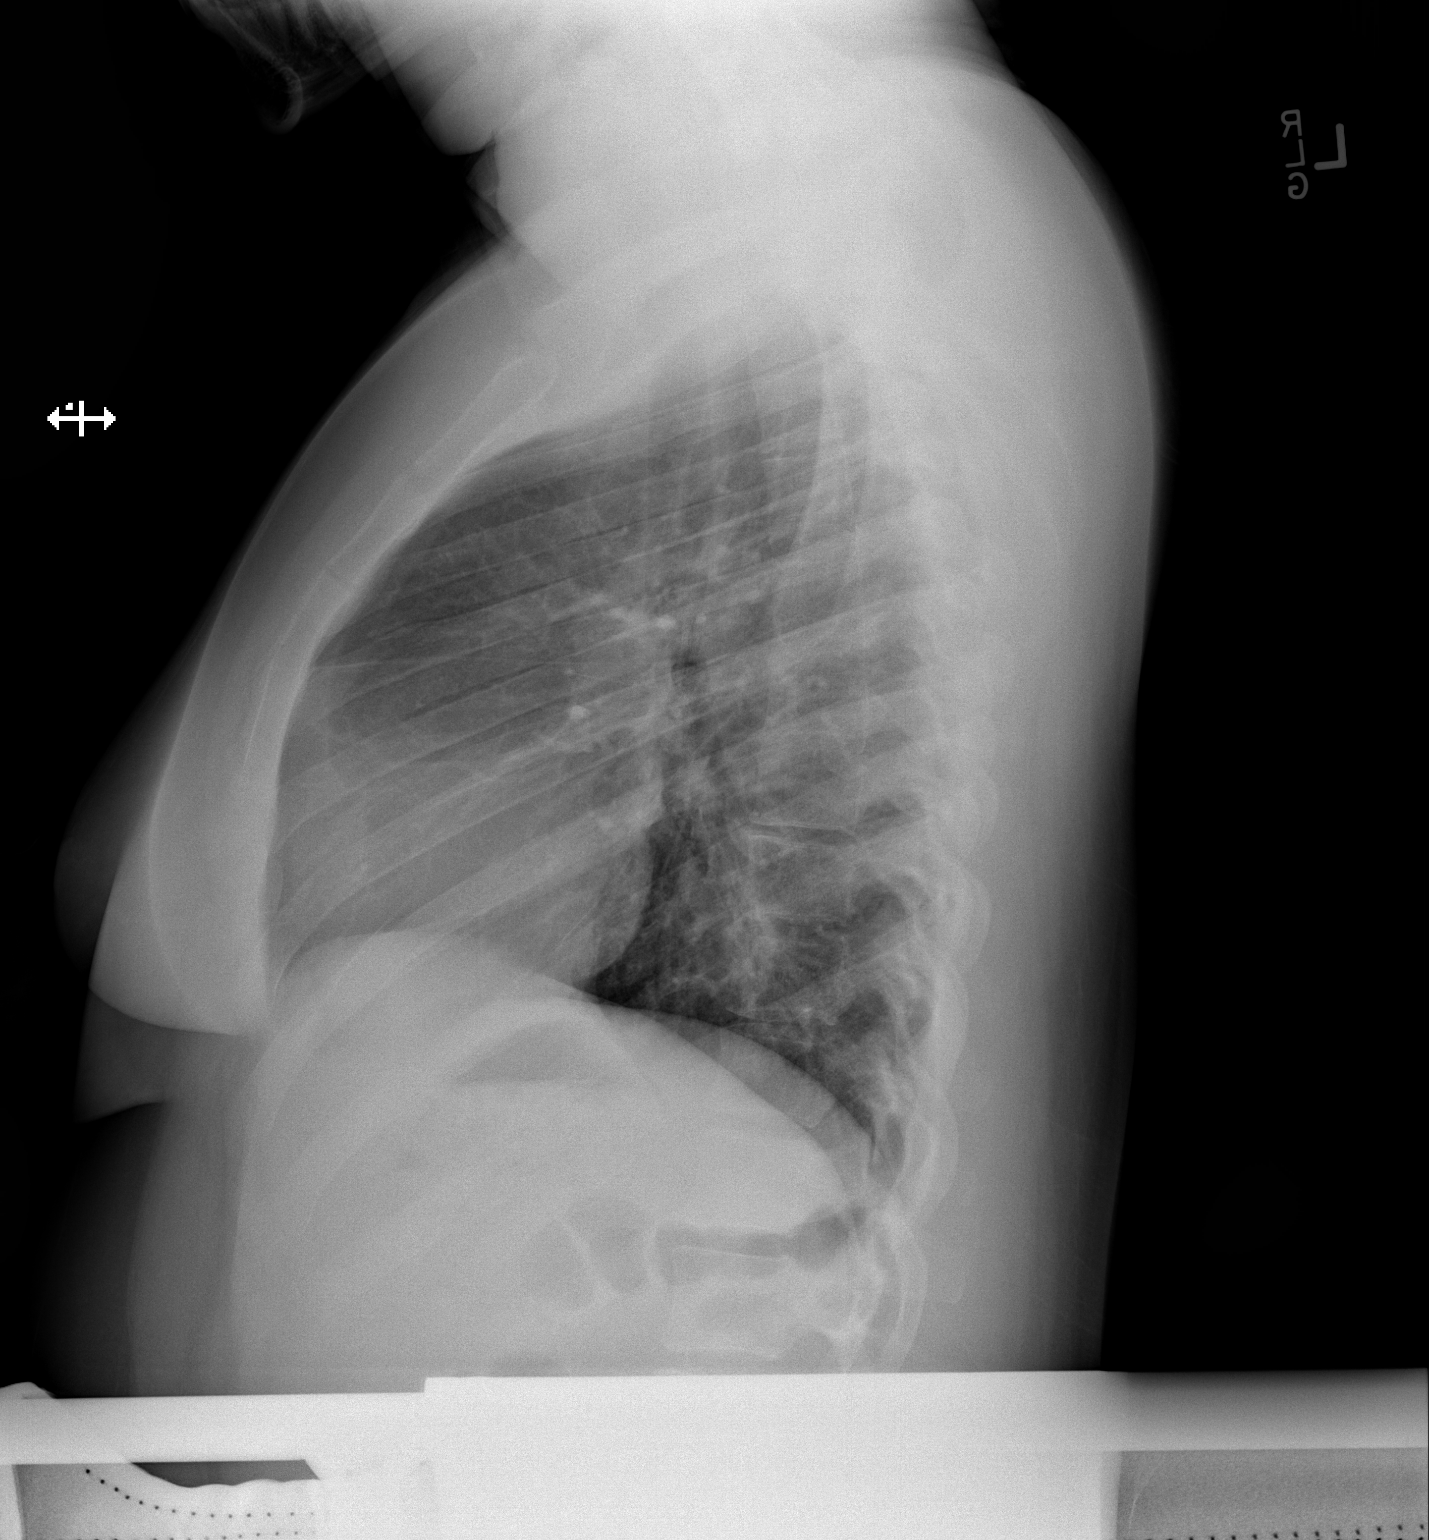

[2 of 2 positions shown; findings below may reference images not displayed]

FINDINGS: The heart size and mediastinal contours are within normal limits.
Both lungs are clear. Scoliosis deformity is identified.
IMPRESSION: 1. No acute cardiopulmonary abnormalities.
